# Patient Record
Sex: Male | Born: 1965 | Race: Black or African American | Hispanic: No | Marital: Married | Smoking: Never smoker
Health system: Southern US, Community
[De-identification: ages and names within clinical notes are randomized; demographics above are authoritative.]

## PROBLEM LIST (undated history)

## (undated) DIAGNOSIS — E119 Type 2 diabetes mellitus without complications: Secondary | ICD-10-CM

## (undated) DIAGNOSIS — F32A Depression, unspecified: Secondary | ICD-10-CM

## (undated) DIAGNOSIS — H409 Unspecified glaucoma: Secondary | ICD-10-CM

## (undated) DIAGNOSIS — I10 Essential (primary) hypertension: Secondary | ICD-10-CM

## (undated) DIAGNOSIS — F419 Anxiety disorder, unspecified: Secondary | ICD-10-CM

## (undated) DIAGNOSIS — N289 Disorder of kidney and ureter, unspecified: Secondary | ICD-10-CM

## (undated) DIAGNOSIS — F329 Major depressive disorder, single episode, unspecified: Secondary | ICD-10-CM

## (undated) DIAGNOSIS — E11319 Type 2 diabetes mellitus with unspecified diabetic retinopathy without macular edema: Secondary | ICD-10-CM

## (undated) DIAGNOSIS — E785 Hyperlipidemia, unspecified: Secondary | ICD-10-CM

## (undated) DIAGNOSIS — Z940 Kidney transplant status: Secondary | ICD-10-CM

## (undated) DIAGNOSIS — H4310 Vitreous hemorrhage, unspecified eye: Secondary | ICD-10-CM

## (undated) DIAGNOSIS — H3320 Serous retinal detachment, unspecified eye: Secondary | ICD-10-CM

## (undated) HISTORY — PX: MANDIBLE SURGERY: SHX707

## (undated) HISTORY — PX: KNEE SURGERY: SHX244

---

## 2000-08-01 ENCOUNTER — Encounter: Admission: RE | Admit: 2000-08-01 | Discharge: 2000-10-30 | Payer: Self-pay | Admitting: Internal Medicine

## 2004-03-21 ENCOUNTER — Emergency Department (HOSPITAL_COMMUNITY): Admission: EM | Admit: 2004-03-21 | Discharge: 2004-03-22 | Payer: Self-pay | Admitting: Emergency Medicine

## 2004-12-14 ENCOUNTER — Emergency Department (HOSPITAL_COMMUNITY): Admission: EM | Admit: 2004-12-14 | Discharge: 2004-12-14 | Payer: Self-pay | Admitting: Emergency Medicine

## 2010-10-13 ENCOUNTER — Emergency Department (HOSPITAL_COMMUNITY)
Admission: EM | Admit: 2010-10-13 | Discharge: 2010-10-14 | Payer: Self-pay | Source: Home / Self Care | Admitting: Emergency Medicine

## 2010-10-19 LAB — CBC
HCT: 46 % (ref 39.0–52.0)
Hemoglobin: 14.8 g/dL (ref 13.0–17.0)
MCH: 27.5 pg (ref 26.0–34.0)
MCHC: 32.2 g/dL (ref 30.0–36.0)
MCV: 85.5 fL (ref 78.0–100.0)
Platelets: 135 10*3/uL — ABNORMAL LOW (ref 150–400)
RBC: 5.38 MIL/uL (ref 4.22–5.81)
RDW: 13.9 % (ref 11.5–15.5)
WBC: 6.2 10*3/uL (ref 4.0–10.5)

## 2010-10-19 LAB — BASIC METABOLIC PANEL
BUN: 33 mg/dL — ABNORMAL HIGH (ref 6–23)
CO2: 27 mEq/L (ref 19–32)
Calcium: 9.7 mg/dL (ref 8.4–10.5)
Chloride: 107 mEq/L (ref 96–112)
Creatinine, Ser: 1.8 mg/dL — ABNORMAL HIGH (ref 0.4–1.5)
GFR calc Af Amer: 50 mL/min — ABNORMAL LOW (ref >60)
GFR calc non Af Amer: 41 mL/min — ABNORMAL LOW (ref >60)
Glucose, Bld: 127 mg/dL — ABNORMAL HIGH (ref 70–99)
Potassium: 4.9 mEq/L (ref 3.5–5.1)
Sodium: 141 mEq/L (ref 135–145)

## 2010-10-19 LAB — DIFFERENTIAL
Basophils Absolute: 0 10*3/uL (ref 0.0–0.1)
Basophils Relative: 0 % (ref 0–1)
Eosinophils Absolute: 0.3 10*3/uL (ref 0.0–0.7)
Eosinophils Relative: 5 % (ref 0–5)
Lymphocytes Relative: 18 % (ref 12–46)
Lymphs Abs: 1.1 10*3/uL (ref 0.7–4.0)
Monocytes Absolute: 0.6 10*3/uL (ref 0.1–1.0)
Monocytes Relative: 9 % (ref 3–12)
Neutro Abs: 4.1 10*3/uL (ref 1.7–7.7)
Neutrophils Relative %: 67 % (ref 43–77)

## 2011-07-15 IMAGING — CT CT HEAD W/O CM
1 of 2 series · 13 of 30 positions shown, 17 images · non-contrast
Comparison: None

CLINICAL DATA: Headache.  Recent right eye surgery.

CT HEAD WITHOUT CONTRAST
TECHNIQUE: Contiguous axial images were obtained from the base of
the skull through the vertex without contrast.

[Series 2: brain · axial · 0.47mm/px · z∈[+160,+278]mm · 13 of 40 slices shown, 17 images]
[im 3/40  brain]
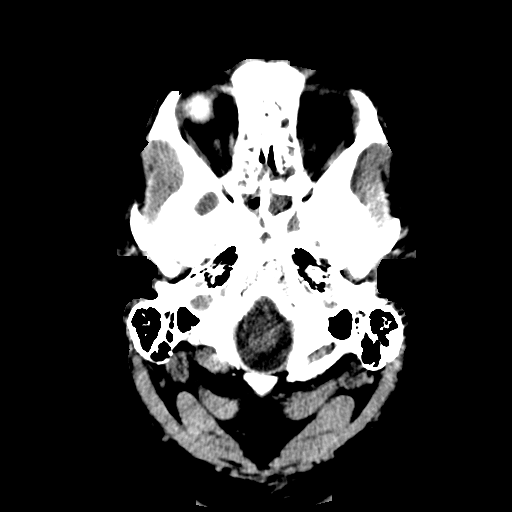
[im 3/40  bone]
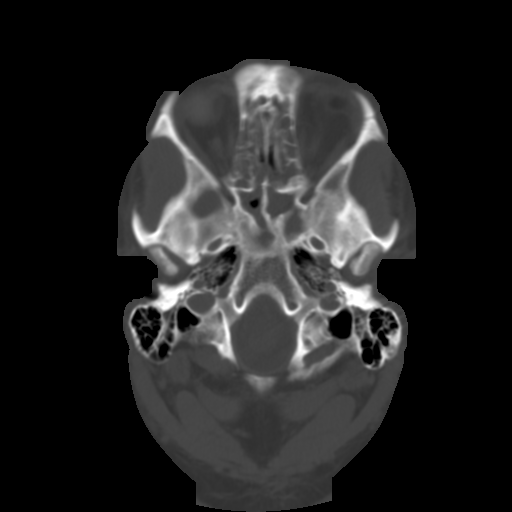
[im 6/40  brain]
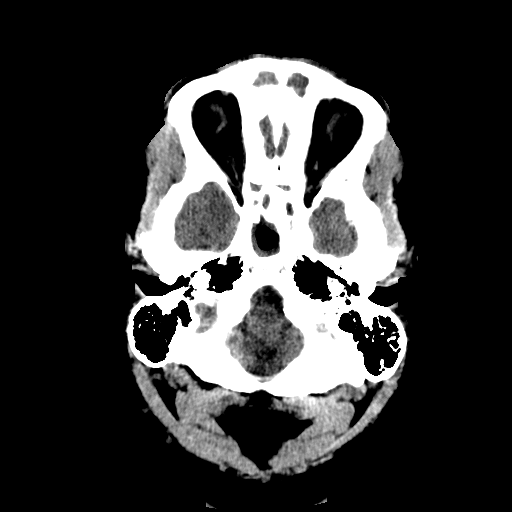
[im 9/40  brain]
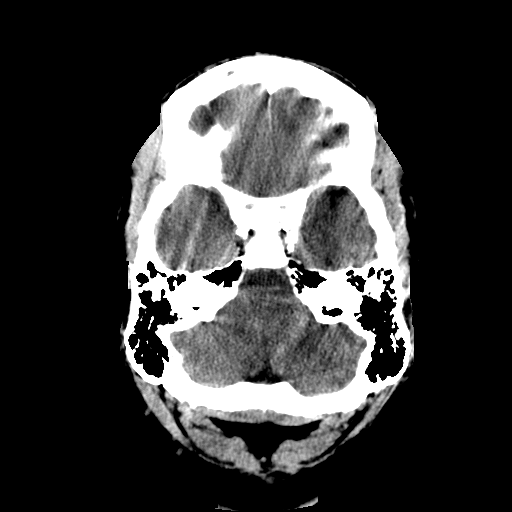
[im 12/40  brain]
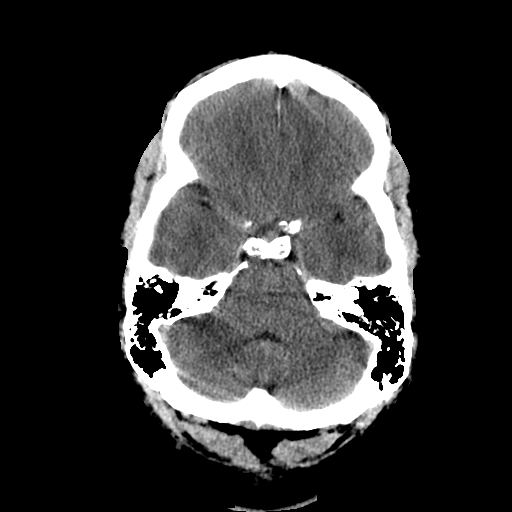
[im 14/40  brain]
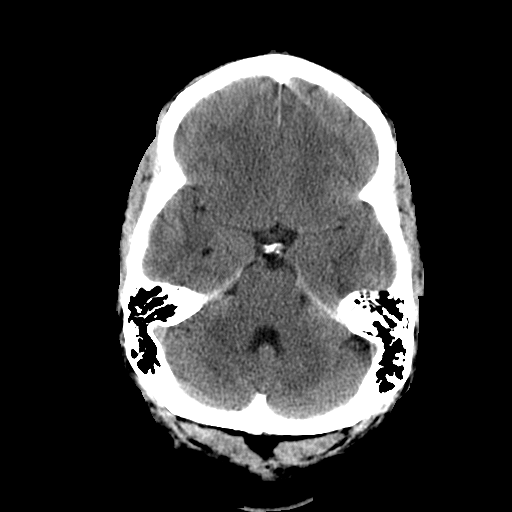
[im 14/40  bone]
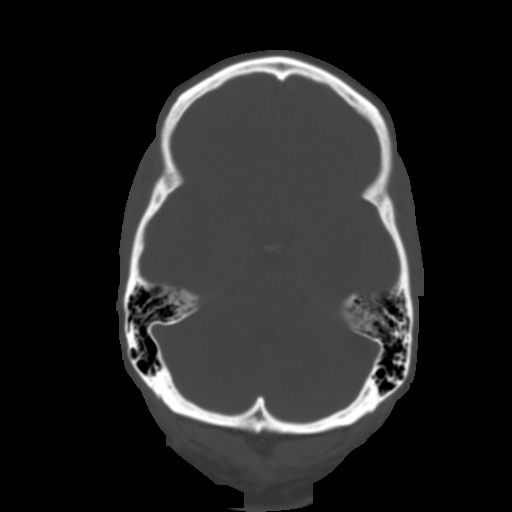
[im 17/40  brain]
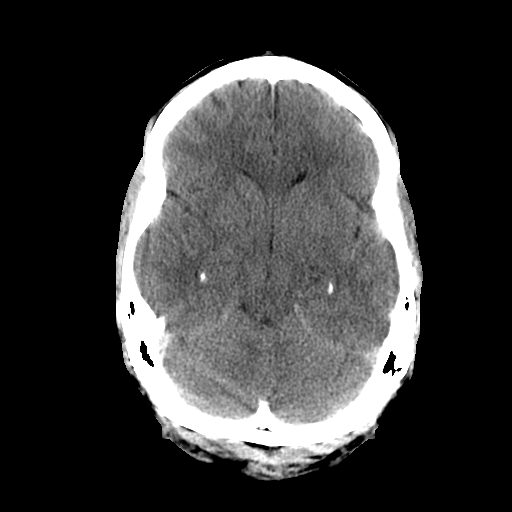
[im 20/40  brain]
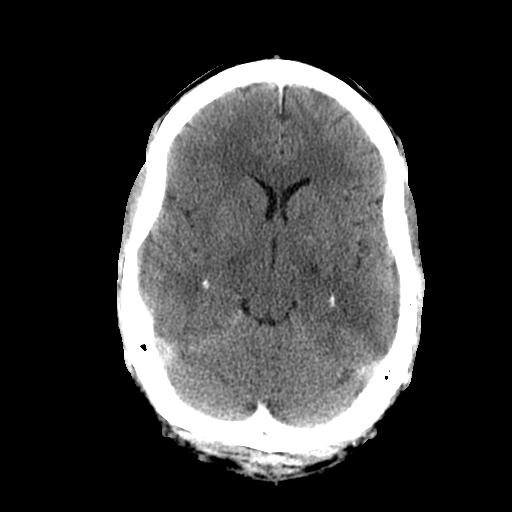
[im 23/40  brain]
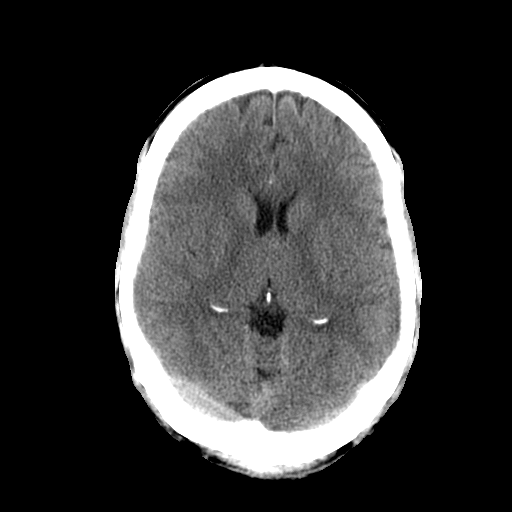
[im 26/40  brain]
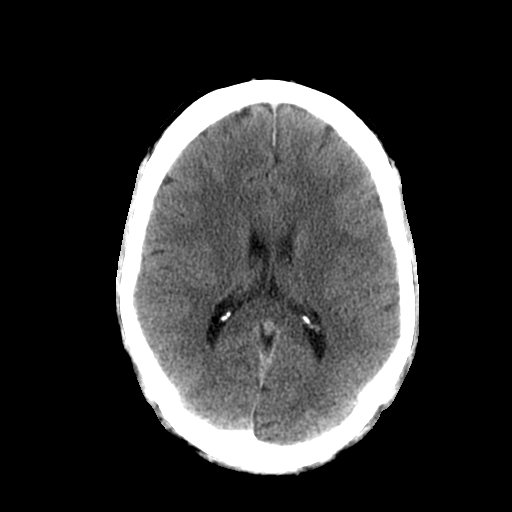
[im 26/40  bone]
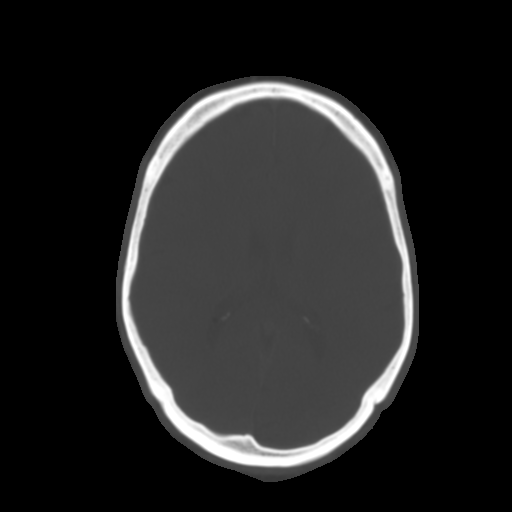
[im 28/40  brain]
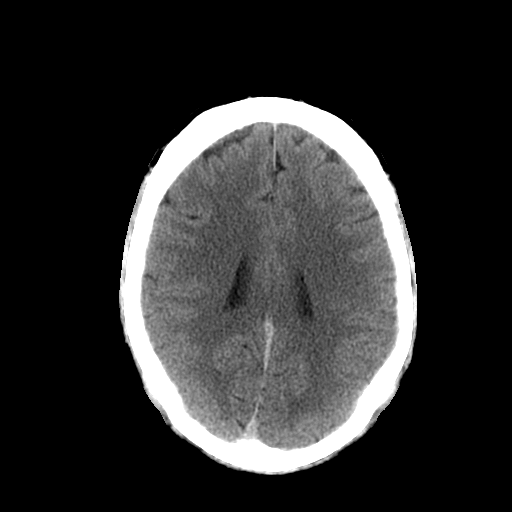
[im 31/40  brain]
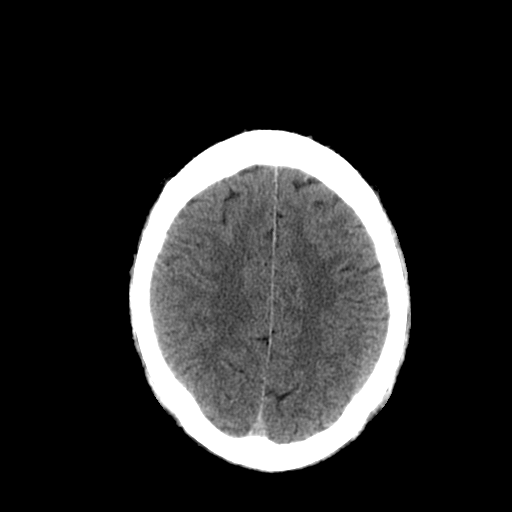
[im 34/40  brain]
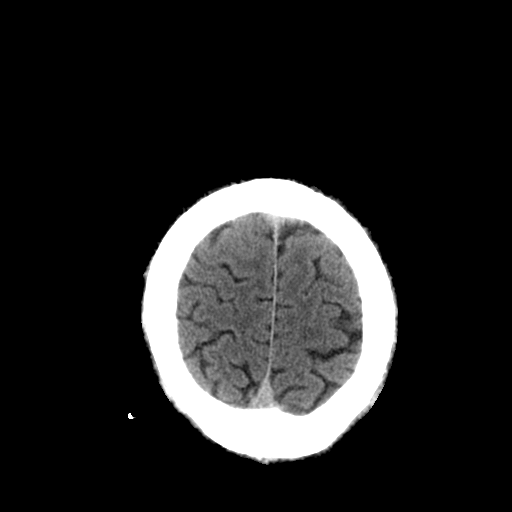
[im 37/40  brain]
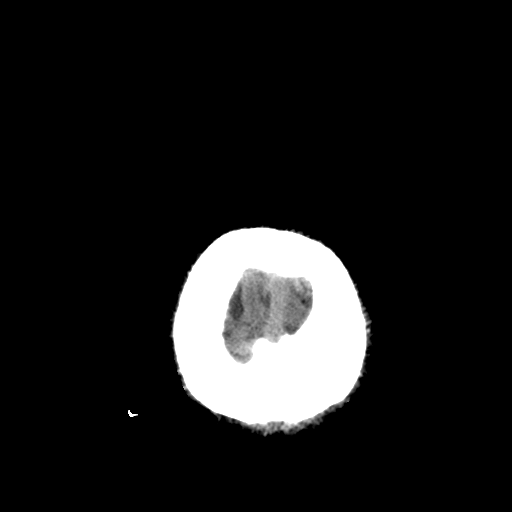
[im 37/40  bone]
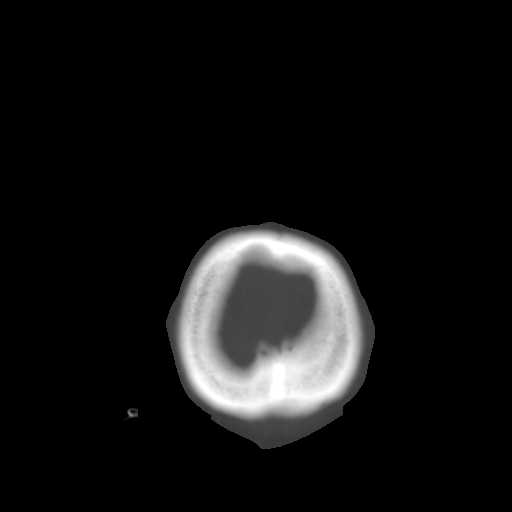

[13 of 30 positions shown; findings below may reference images not displayed]

FINDINGS: No acute intracranial abnormalities are identified,
including mass lesion or mass effect, hydrocephalus, extra-axial
fluid collection, midline shift, hemorrhage, or acute infarction.

The visualized bony calvarium is unremarkable.

Lucency within the left globe and high density within the right
globe are noted compatible with the prior surgery/ procedure - but
correlate clinically.

Opacified paranasal sinuses and mucosal thickening within the
sphenoid sinus is noted.
IMPRESSION: No evidence of acute intracranial abnormality.

Evidence of bilateral globe procedures/surgery - correlate
clinically.

Chronic paranasal sinusitis.

## 2011-10-05 HISTORY — PX: FOOT SURGERY: SHX648

## 2014-12-01 ENCOUNTER — Emergency Department (HOSPITAL_COMMUNITY): Payer: Medicare Other

## 2014-12-01 ENCOUNTER — Emergency Department (HOSPITAL_COMMUNITY)
Admission: EM | Admit: 2014-12-01 | Discharge: 2014-12-01 | Disposition: A | Discharge status: Home / Self Care | Payer: Medicare Other | Attending: Emergency Medicine | Admitting: Emergency Medicine

## 2014-12-01 ENCOUNTER — Encounter (HOSPITAL_COMMUNITY): Payer: Self-pay | Admitting: Emergency Medicine

## 2014-12-01 DIAGNOSIS — Z8669 Personal history of other diseases of the nervous system and sense organs: Secondary | ICD-10-CM | POA: Insufficient documentation

## 2014-12-01 DIAGNOSIS — J45901 Unspecified asthma with (acute) exacerbation: Secondary | ICD-10-CM | POA: Diagnosis not present

## 2014-12-01 DIAGNOSIS — I10 Essential (primary) hypertension: Secondary | ICD-10-CM | POA: Diagnosis not present

## 2014-12-01 DIAGNOSIS — E119 Type 2 diabetes mellitus without complications: Secondary | ICD-10-CM | POA: Diagnosis not present

## 2014-12-01 DIAGNOSIS — R0602 Shortness of breath: Secondary | ICD-10-CM | POA: Diagnosis present

## 2014-12-01 DIAGNOSIS — N289 Disorder of kidney and ureter, unspecified: Secondary | ICD-10-CM

## 2014-12-01 DIAGNOSIS — R112 Nausea with vomiting, unspecified: Secondary | ICD-10-CM

## 2014-12-01 DIAGNOSIS — R000 Tachycardia, unspecified: Secondary | ICD-10-CM | POA: Diagnosis not present

## 2014-12-01 HISTORY — DX: Major depressive disorder, single episode, unspecified: F32.9

## 2014-12-01 HISTORY — DX: Unspecified glaucoma: H40.9

## 2014-12-01 HISTORY — DX: Depression, unspecified: F32.A

## 2014-12-01 HISTORY — DX: Serous retinal detachment, unspecified eye: H33.20

## 2014-12-01 HISTORY — DX: Type 2 diabetes mellitus with unspecified diabetic retinopathy without macular edema: E11.319

## 2014-12-01 HISTORY — DX: Kidney transplant status: Z94.0

## 2014-12-01 HISTORY — DX: Vitreous hemorrhage, unspecified eye: H43.10

## 2014-12-01 HISTORY — DX: Type 2 diabetes mellitus without complications: E11.9

## 2014-12-01 HISTORY — DX: Essential (primary) hypertension: I10

## 2014-12-01 HISTORY — DX: Anxiety disorder, unspecified: F41.9

## 2014-12-01 HISTORY — DX: Hyperlipidemia, unspecified: E78.5

## 2014-12-01 HISTORY — DX: Disorder of kidney and ureter, unspecified: N28.9

## 2014-12-01 LAB — COMPREHENSIVE METABOLIC PANEL
ALT: 15 U/L (ref 0–53)
AST: 17 U/L (ref 0–37)
Albumin: 3.1 g/dL — ABNORMAL LOW (ref 3.5–5.2)
Alkaline Phosphatase: 95 U/L (ref 39–117)
Anion gap: 3 — ABNORMAL LOW (ref 5–15)
BUN: 36 mg/dL — ABNORMAL HIGH (ref 6–23)
CO2: 28 mmol/L (ref 19–32)
Calcium: 8.7 mg/dL (ref 8.4–10.5)
Chloride: 106 mmol/L (ref 96–112)
Creatinine, Ser: 2.85 mg/dL — ABNORMAL HIGH (ref 0.50–1.35)
GFR calc Af Amer: 28 mL/min — ABNORMAL LOW (ref >90)
GFR calc non Af Amer: 24 mL/min — ABNORMAL LOW (ref >90)
Glucose, Bld: 277 mg/dL — ABNORMAL HIGH (ref 70–99)
Potassium: 4.6 mmol/L (ref 3.5–5.1)
Sodium: 137 mmol/L (ref 135–145)
Total Bilirubin: 0.4 mg/dL (ref 0.3–1.2)
Total Protein: 5.9 g/dL — ABNORMAL LOW (ref 6.0–8.3)

## 2014-12-01 LAB — CBC WITH DIFFERENTIAL/PLATELET
Basophils Absolute: 0 10*3/uL (ref 0.0–0.1)
Basophils Relative: 0 % (ref 0–1)
Eosinophils Absolute: 0.6 10*3/uL (ref 0.0–0.7)
Eosinophils Relative: 7 % — ABNORMAL HIGH (ref 0–5)
HCT: 38.6 % — ABNORMAL LOW (ref 39.0–52.0)
Hemoglobin: 12.8 g/dL — ABNORMAL LOW (ref 13.0–17.0)
Lymphocytes Relative: 12 % (ref 12–46)
Lymphs Abs: 0.9 10*3/uL (ref 0.7–4.0)
MCH: 27.5 pg (ref 26.0–34.0)
MCHC: 33.2 g/dL (ref 30.0–36.0)
MCV: 83 fL (ref 78.0–100.0)
Monocytes Absolute: 0.7 10*3/uL (ref 0.1–1.0)
Monocytes Relative: 9 % (ref 3–12)
Neutro Abs: 5.7 10*3/uL (ref 1.7–7.7)
Neutrophils Relative %: 72 % (ref 43–77)
Platelets: 181 10*3/uL (ref 150–400)
RBC: 4.65 MIL/uL (ref 4.22–5.81)
RDW: 13.5 % (ref 11.5–15.5)
WBC: 7.9 10*3/uL (ref 4.0–10.5)

## 2014-12-01 LAB — BRAIN NATRIURETIC PEPTIDE: B Natriuretic Peptide: 49.4 pg/mL (ref 0.0–100.0)

## 2014-12-01 LAB — TROPONIN I: Troponin I: <0.03 ng/mL (ref <0.031)

## 2014-12-01 LAB — LIPASE, BLOOD: Lipase: 20 U/L (ref 11–59)

## 2014-12-01 MED ORDER — PREDNISONE 10 MG PO TABS: 60.0000 mg | ORAL_TABLET | Freq: Every day | ORAL | Status: AC

## 2014-12-01 MED ORDER — METHYLPREDNISOLONE SODIUM SUCC 125 MG IJ SOLR
125.0000 mg | Freq: Once | INTRAMUSCULAR | Status: AC
  Administered 2014-12-01: 125 mg via INTRAVENOUS
  Filled 2014-12-01: qty 2

## 2014-12-01 MED ORDER — ALBUTEROL (5 MG/ML) CONTINUOUS INHALATION SOLN
10.0000 mg/h | INHALATION_SOLUTION | Freq: Once | RESPIRATORY_TRACT | Status: AC
  Administered 2014-12-01: 10 mg/h via RESPIRATORY_TRACT
  Filled 2014-12-01: qty 20

## 2014-12-01 MED ORDER — ONDANSETRON 8 MG PO TBDP: 8.0000 mg | ORAL_TABLET | Freq: Three times a day (TID) | ORAL | Status: AC | PRN

## 2014-12-01 MED ORDER — IPRATROPIUM BROMIDE 0.02 % IN SOLN
1.0000 mg | Freq: Once | RESPIRATORY_TRACT | Status: AC
  Administered 2014-12-01: 1 mg via RESPIRATORY_TRACT
  Filled 2014-12-01: qty 5

## 2014-12-01 NOTE — Discharge Instructions (Signed)
Bronchospasm °A bronchospasm is a spasm or tightening of the airways going into the lungs. During a bronchospasm breathing becomes more difficult because the airways get smaller. When this happens there can be coughing, a whistling sound when breathing (wheezing), and difficulty breathing. Bronchospasm is often associated with asthma, but not all patients who experience a bronchospasm have asthma. °CAUSES  °A bronchospasm is caused by inflammation or irritation of the airways. The inflammation or irritation may be triggered by:  °· Allergies (such as to animals, pollen, food, or mold). Allergens that cause bronchospasm may cause wheezing immediately after exposure or many hours later.   °· Infection. Viral infections are believed to be the most common cause of bronchospasm.   °· Exercise.   °· Irritants (such as pollution, cigarette smoke, strong odors, aerosol sprays, and paint fumes).   °· Weather changes. Winds increase molds and pollens in the air. Rain refreshes the air by washing irritants out. Cold air may cause inflammation.   °· Stress and emotional upset.   °SIGNS AND SYMPTOMS  °· Wheezing.   °· Excessive nighttime coughing.   °· Frequent or severe coughing with a simple cold.   °· Chest tightness.   °· Shortness of breath.   °DIAGNOSIS  °Bronchospasm is usually diagnosed through a history and physical exam. Tests, such as chest X-rays, are sometimes done to look for other conditions. °TREATMENT  °· Inhaled medicines can be given to open up your airways and help you breathe. The medicines can be given using either an inhaler or a nebulizer machine. °· Corticosteroid medicines may be given for severe bronchospasm, usually when it is associated with asthma. °HOME CARE INSTRUCTIONS  °· Always have a plan prepared for seeking medical care. Know when to call your health care provider and local emergency services (911 in the [place] Know where you can access local emergency care. °· Only take medicines as  directed by your health care provider. °· If you were prescribed an inhaler or nebulizer machine, ask your health care provider to explain how to use it correctly. Always use a spacer with your inhaler if you were given one. °· It is necessary to remain calm during an attack. Try to relax and breathe more slowly.  °· Control your home environment in the following ways:   °¨ Change your heating and air conditioning filter at least once a month.   °¨ Limit your use of fireplaces and wood stoves. °¨ Do not smoke and do not allow smoking in your home.   °¨ Avoid exposure to perfumes and fragrances.   °¨ Get rid of pests (such as roaches and mice) and their droppings.   °¨ Throw away plants if you see mold on them.   °¨ Keep your house clean and dust free.   °¨ Replace carpet with wood, tile, or vinyl flooring. Carpet can trap dander and dust.   °¨ Use allergy-proof pillows, mattress covers, and box spring covers.   °¨ Wash bed sheets and blankets every week in hot water and dry them in a dryer.   °¨ Use blankets that are made of polyester or cotton.   °¨ Wash hands frequently. °SEEK MEDICAL CARE IF:  °· You have muscle aches.   °· You have chest pain.   °· The sputum changes from clear or white to yellow, green, gray, or bloody.   °· The sputum you cough up gets thicker.   °· There are problems that may be related to the medicine you are given, such as a rash, itching, swelling, or trouble breathing.   °SEEK IMMEDIATE MEDICAL CARE IF:  °· You have worsening wheezing and coughing even   after taking your prescribed medicines.   °· You have increased difficulty breathing.   °· You develop severe chest pain. °MAKE SURE YOU:  °· Understand these instructions. °· Will watch your condition. °· Will get help right away if you are not doing well or get worse. °Document Released: 09/23/2003 Document Revised: 09/25/2013 Document Reviewed: 03/12/2013 °[employer]® Patient Information ©2015 [employer]. This information is not  intended to replace advice given to you by your health care provider. Make sure you discuss any questions you have with your health care provider. ° °

## 2014-12-01 NOTE — ED Notes (Signed)
Respiratory notified of need for Hour Long Neb.

## 2014-12-01 NOTE — ED Notes (Signed)
Pt presents via [facility] with c/o asthma exacerbation and Shortness of breath with inspiratory and expiratory wheezes. Pt given 10mg  Albuterol, .5mg  Atrovent PTA.  [place] when began having SOB.  Pt is blind, has hx renal transplant, HTN, diabetes, and Asthma. 20g IV in the left wrist. 157/87, 112bpm, 233CBG. Sat93% initially, 100% on O2.

## 2014-12-01 NOTE — ED Provider Notes (Addendum)
6:33 PM Patient feels much better this time.  Discharge home in good condition.  Likely bronchospasm.  Home with antinausea medicine.  Abdominal exam is benign.  Lungs are clear at this time.  Home with several days of steroids.  She understands return to the ER for new or worsening symptoms.  Doubt PE.  Family reports baseline creatinine recently was 3.0 thus his creatinine of 2.8 today is consistent with his renal insufficiency.  His nephrology team in [place] is following [place] kidney function closely.  Lyanne CoKevin M Jayline Kilburg, MD 12/01/14 [phone]  Lyanne CoKevin M Jawon Dipiero, MD 12/01/14 [phone]

## 2014-12-01 NOTE — ED Provider Notes (Signed)
CSN: [id number]     Arrival date & time 12/01/14  1422 History   First MD Initiated Contact with Patient 12/01/14 1436     Chief Complaint  Patient presents with  . Shortness of Breath     HPI  Pt was seen at 1450.  Per EMS and pt report, c/o gradual onset and worsening of persistent wheezing and SOB that began PTA.  Describes his symptoms as "my asthma is acting up."  EMS states they gave pt albuterol/atrovent neb en route without significant improvement. Pt states he has had several episodes of N/V since waking up this morning. States N/V is often a trigger for his asthma. Denies CP/palpitations, no back pain, no abd pain, no diarrhea, no fevers, no rash.    Past Medical History  Diagnosis Date  . Retinal detachment   . Hypertension   . Diabetes mellitus without complication   . Renal transplant recipient   . Vitreous hemorrhage   . Anxiety and depression   . Glaucoma   . Diabetic retinopathy   . Hyperlipidemia   . Renal insufficiency    Past Surgical History  Procedure Laterality Date  . Knee surgery  1990s    Left Knee  . Mandible surgery  1970s  . Foot surgery  2013    Toe Removal, Right    History  Substance Use Topics  . Smoking status: Never Smoker   . Smokeless tobacco: Never Used  . Alcohol Use: No    Review of Systems ROS: Statement: All systems negative except as marked or noted in the HPI; Constitutional: Negative for fever and chills. ; ; Eyes: Negative for eye pain, redness and discharge. ; ; ENMT: Negative for ear pain, hoarseness, nasal congestion, sinus pressure and sore throat. ; ; Cardiovascular: Negative for chest pain, palpitations, diaphoresis, and peripheral edema. ; ; Respiratory: +SOB, wheezing. Negative for cough and stridor. ; ; Gastrointestinal: +N/V. Negative for diarrhea, abdominal pain, blood in stool, hematemesis, jaundice and rectal bleeding. . ; ; Genitourinary: Negative for dysuria, flank pain and hematuria. ; ; Musculoskeletal: Negative  for back pain and neck pain. Negative for swelling and trauma.; ; Skin: Negative for pruritus, rash, abrasions, blisters, bruising and skin lesion.; ; Neuro: Negative for headache, lightheadedness and neck stiffness. Negative for weakness, altered level of consciousness , altered mental status, extremity weakness, paresthesias, involuntary movement, seizure and syncope.      Allergies  Contrast media  Home Medications   Prior to Admission medications   Not on File   BP 153/70 mmHg  Pulse 113  Temp(Src) 97.7 F (36.5 C) (Axillary)  Resp 13  SpO2 100% Physical Exam  1455; Physical examination:  Nursing notes reviewed; Vital signs and O2 SAT reviewed;  Constitutional: Well developed, Well nourished, Well hydrated, In no acute distress; Head:  Normocephalic, atraumatic; Eyes: EOMI, PERRL, No scleral icterus; ENMT: Mouth and pharynx normal, Mucous membranes moist; Neck: Supple, Full range of motion, No lymphadenopathy; Cardiovascular: Tachycardic rate and rhythm, No gallop; Respiratory: Breath sounds diminished & equal bilaterally, insp/exp wheezes bilat. Occasional audible wheezing. Speaking long phrases. No retrax or access mm use. Normal respiratory effort/excursion; Chest: Nontender, Movement normal; Abdomen: Soft, Nontender, Nondistended, Normal bowel sounds; Genitourinary: No CVA tenderness; Extremities: Pulses normal, No tenderness, No edema, No calf edema or asymmetry.; Neuro: AA&Ox3, Major CN grossly intact.  Speech clear. No gross focal motor or sensory deficits in extremities.; Skin: Color normal, Warm, Dry.   ED Course  Procedures     EKG  Interpretation   Date/Time:  Sunday December 01 2014 14:45:36 EST Ventricular Rate:  113 PR Interval:  125 QRS Duration: 70 QT Interval:  321 QTC Calculation: 440 R Axis:   66 Text Interpretation:  Sinus tachycardia Probable left atrial enlargement  Nonspecific T abnormalities, lateral leads Baseline wander No old tracing  to compare  Confirmed by [facility]  MD, Nicholos JohnsKATHLEEN [phone]([id number]) on 12/01/2014  3:09:37 PM      MDM  MDM Reviewed: previous chart, nursing note and vitals Reviewed previous: labs and ECG Interpretation: labs, ECG and x-ray     Results for orders placed or performed during the hospital encounter of 12/01/14  CBC with Differential  Result Value Ref Range   WBC 7.9 4.0 - 10.5 K/uL   RBC 4.65 4.22 - 5.81 MIL/uL   Hemoglobin 12.8 (L) 13.0 - 17.0 g/dL   HCT 78.238.6 (L) 95.639.0 - 21.352.0 %   MCV 83.0 78.0 - 100.0 fL   MCH 27.5 26.0 - 34.0 pg   MCHC 33.2 30.0 - 36.0 g/dL   RDW 08.613.5 57.811.5 - 46.915.5 %   Platelets 181 150 - 400 K/uL   Neutrophils Relative % 72 43 - 77 %   Neutro Abs 5.7 1.7 - 7.7 K/uL   Lymphocytes Relative 12 12 - 46 %   Lymphs Abs 0.9 0.7 - 4.0 K/uL   Monocytes Relative 9 3 - 12 %   Monocytes Absolute 0.7 0.1 - 1.0 K/uL   Eosinophils Relative 7 (H) 0 - 5 %   Eosinophils Absolute 0.6 0.0 - 0.7 K/uL   Basophils Relative 0 0 - 1 %   Basophils Absolute 0.0 0.0 - 0.1 K/uL   Dg Chest Port 1 View 12/01/2014   CLINICAL DATA:  Shortness of breath, wheezing for past 2 days.  EXAM: PORTABLE CHEST - 1 VIEW  COMPARISON:  12/14/2004  FINDINGS: Heart and mediastinal contours are within normal limits. Right base atelectasis with small right pleural effusion. There is peribronchial thickening. Left lung is clear. No acute bony abnormality.  IMPRESSION: Bronchitic changes. Right base atelectasis with small right effusion.   Electronically Signed   By: Charlett NoseKevin  Dover M.D.   On: 12/01/2014 15:30    1630:  ED RN dosing IV solumedrol now. Hour long neb completed. Lungs continue diminished, coarse bilat. Pt states he "feels a little better than when I came in" and "not as tight." Labs pending. Will need to ambulate with pulse ox. Re-eval. PO trial. Sign out to Dr. Patria Maneampos.   Samuel JesterKathleen Cherylene Ferrufino, DO 12/01/14 [phone]

## 2014-12-01 NOTE — ED Notes (Signed)
Pt comfortable with discharge and follow up instructions. Prescriptions x2. 

## 2014-12-01 NOTE — ED Notes (Signed)
Patient reports waking up with upset stomach. Patient reports three episodes of vomiting with nausea throughout the day. Patient denies any nausea at this time.

## 2014-12-01 NOTE — ED Notes (Signed)
Pt ambulated with steady gait, SpO2 98% RA and Hr 130bpm. Dr. Clarene DukeMcManus aware.

## 2015-09-01 IMAGING — CR DG CHEST 1V PORT
1 series · 1 of 1 positions shown · non-contrast
Comparison: 12/14/2004

CLINICAL DATA: Shortness of breath, wheezing for past 2 days.

EXAM:
PORTABLE CHEST - 1 VIEW

[AP]
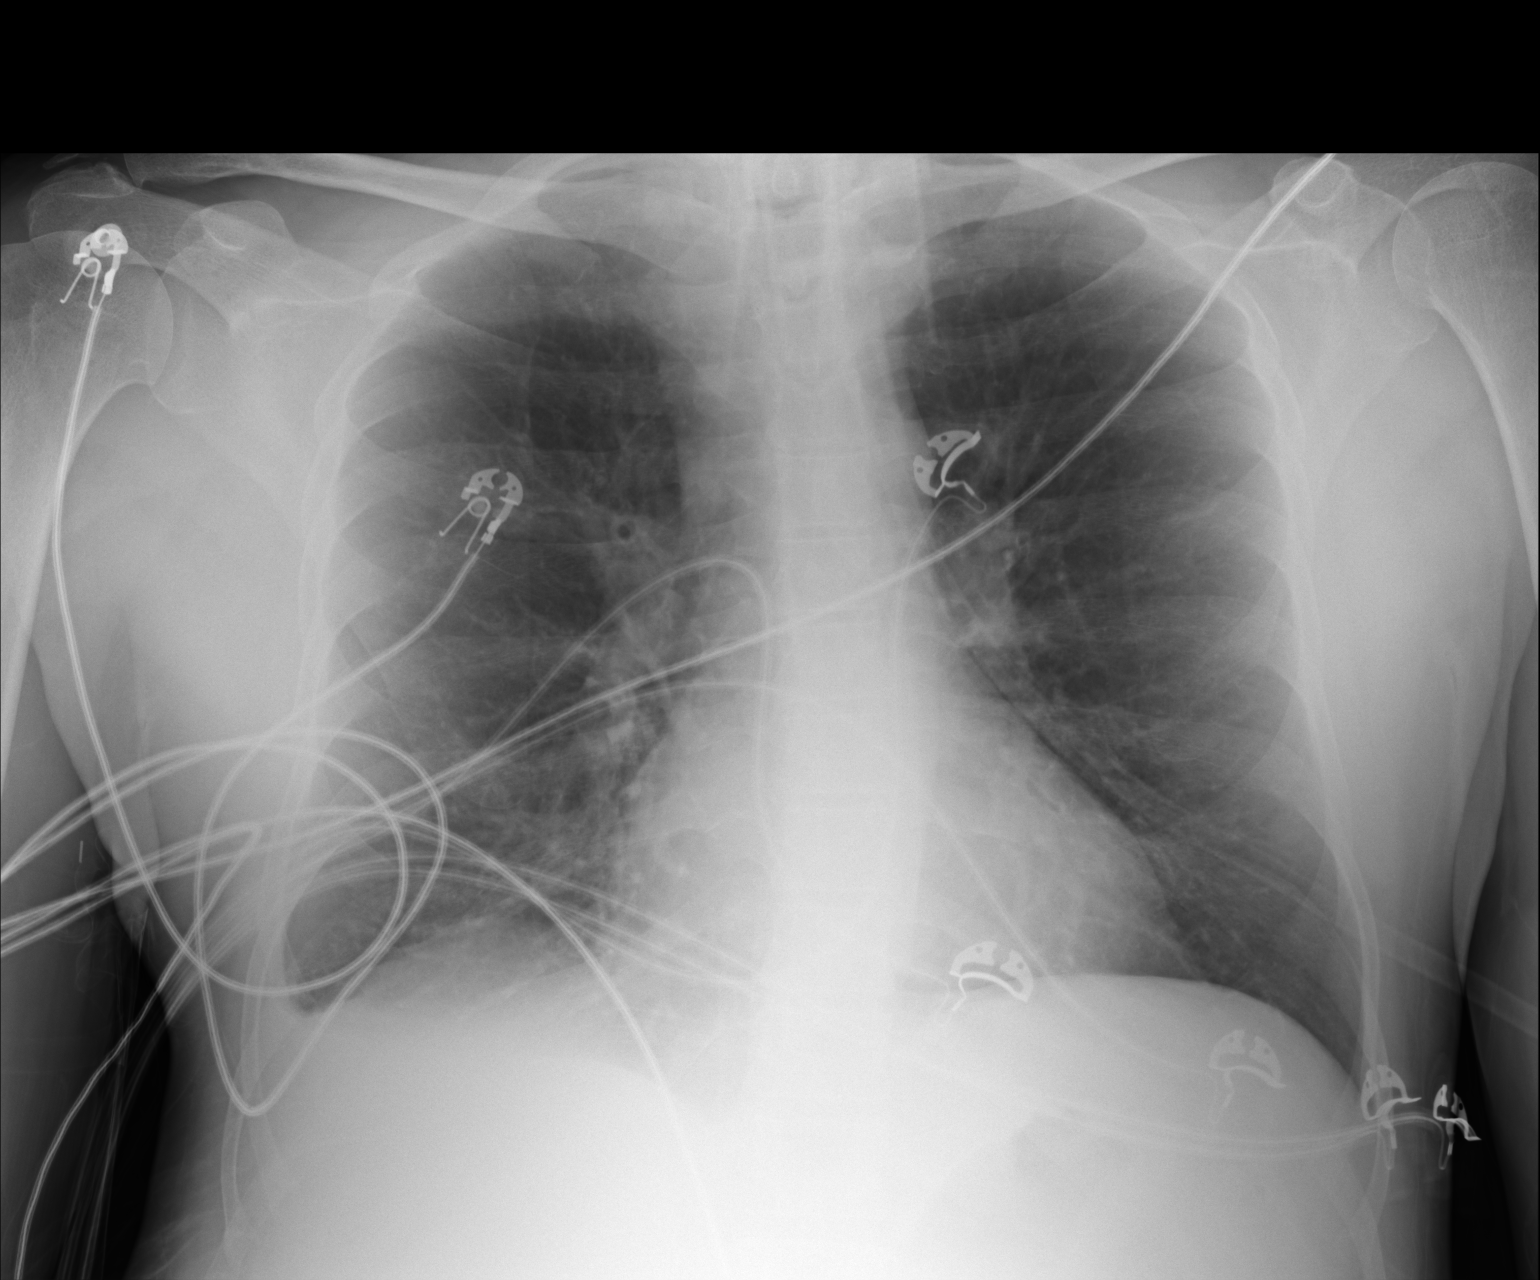

[1 of 1 positions shown; findings below may reference images not displayed]

FINDINGS: Heart and mediastinal contours are within normal limits. Right base
atelectasis with small right pleural effusion. There is
peribronchial thickening. Left lung is clear. No acute bony
abnormality.
IMPRESSION: Bronchitic changes. Right base atelectasis with small right
effusion.

## 2017-11-04 DEATH — deceased
